# Patient Record
Sex: Male | Born: 1990 | Race: White | Hispanic: No | Marital: Single | Smoking: Former smoker
Health system: Southern US, Community
[De-identification: ages and names within clinical notes are randomized; demographics above are authoritative.]

## PROBLEM LIST (undated history)

## (undated) DIAGNOSIS — I10 Essential (primary) hypertension: Principal | ICD-10-CM

## (undated) DIAGNOSIS — R569 Unspecified convulsions: Secondary | ICD-10-CM

## (undated) HISTORY — DX: Essential (primary) hypertension: I10

---
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
## 2018-01-11 ENCOUNTER — Encounter: Payer: Self-pay | Admitting: Family Medicine

## 2018-01-11 ENCOUNTER — Ambulatory Visit (INDEPENDENT_AMBULATORY_CARE_PROVIDER_SITE_OTHER): Payer: Self-pay | Admitting: Family Medicine

## 2018-01-11 VITALS — BP 144/98 | HR 88 | Temp 97.9°F | Resp 14 | Ht 71.0 in | Wt 212.0 lb

## 2018-01-11 DIAGNOSIS — R569 Unspecified convulsions: Secondary | ICD-10-CM

## 2018-01-11 DIAGNOSIS — Z0000 Encounter for general adult medical examination without abnormal findings: Secondary | ICD-10-CM

## 2018-01-11 LAB — POCT URINALYSIS DIPSTICK
Bilirubin, UA: NEGATIVE
Blood, UA: NEGATIVE
Glucose, UA: NEGATIVE
Ketones, UA: NEGATIVE
LEUKOCYTES UA: NEGATIVE
NITRITE UA: NEGATIVE
PROTEIN UA: NEGATIVE
SPEC GRAV UA: 1.01 (ref 1.010–1.025)
Urobilinogen, UA: 0.2 E.U./dL
pH, UA: 7 (ref 5.0–8.0)
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
## 2018-01-12 ENCOUNTER — Telehealth: Payer: Self-pay

## 2018-01-12 LAB — CBC WITH DIFFERENTIAL/PLATELET
BASOS: 1 %
Basophils Absolute: 0 10*3/uL (ref 0.0–0.2)
EOS (ABSOLUTE): 0.4 10*3/uL (ref 0.0–0.4)
EOS: 7 %
HEMATOCRIT: 42.9 % (ref 37.5–51.0)
Hemoglobin: 14.4 g/dL (ref 13.0–17.7)
Immature Grans (Abs): 0 10*3/uL (ref 0.0–0.1)
Immature Granulocytes: 0 %
LYMPHS ABS: 1.6 10*3/uL (ref 0.7–3.1)
Lymphs: 26 %
MCH: 29.6 pg (ref 26.6–33.0)
MCHC: 33.6 g/dL (ref 31.5–35.7)
MCV: 88 fL (ref 79–97)
MONOS ABS: 0.7 10*3/uL (ref 0.1–0.9)
Monocytes: 11 %
Neutrophils Absolute: 3.5 10*3/uL (ref 1.4–7.0)
Neutrophils: 55 %
Platelets: 225 10*3/uL (ref 150–379)
RBC: 4.86 x10E6/uL (ref 4.14–5.80)
RDW: 12.6 % (ref 12.3–15.4)
WBC: 6.3 10*3/uL (ref 3.4–10.8)

## 2018-01-12 LAB — COMPREHENSIVE METABOLIC PANEL
ALT: 47 IU/L — AB (ref 0–44)
AST: 24 IU/L (ref 0–40)
Albumin/Globulin Ratio: 2 (ref 1.2–2.2)
Albumin: 4.9 g/dL (ref 3.5–5.5)
Alkaline Phosphatase: 70 IU/L (ref 39–117)
BUN/Creatinine Ratio: 7 — ABNORMAL LOW (ref 9–20)
BUN: 8 mg/dL (ref 6–20)
Bilirubin Total: <0.2 mg/dL (ref 0.0–1.2)
CALCIUM: 9.9 mg/dL (ref 8.7–10.2)
CO2: 25 mmol/L (ref 20–29)
CREATININE: 1.09 mg/dL (ref 0.76–1.27)
Chloride: 102 mmol/L (ref 96–106)
GFR calc Af Amer: 108 mL/min/{1.73_m2} (ref >59)
GFR, EST NON AFRICAN AMERICAN: 94 mL/min/{1.73_m2} (ref >59)
Globulin, Total: 2.4 g/dL (ref 1.5–4.5)
Glucose: 109 mg/dL — ABNORMAL HIGH (ref 65–99)
POTASSIUM: 4.4 mmol/L (ref 3.5–5.2)
Sodium: 142 mmol/L (ref 134–144)
Total Protein: 7.3 g/dL (ref 6.0–8.5)

## 2018-01-12 LAB — LIPID PANEL
CHOL/HDL RATIO: 4.6 ratio (ref 0.0–5.0)
Cholesterol, Total: 179 mg/dL (ref 100–199)
HDL: 39 mg/dL — ABNORMAL LOW (ref >39)
LDL Calculated: 81 mg/dL (ref 0–99)
Triglycerides: 297 mg/dL — ABNORMAL HIGH (ref 0–149)
VLDL Cholesterol Cal: 59 mg/dL — ABNORMAL HIGH (ref 5–40)

## 2018-01-12 LAB — TSH: TSH: 2.47 u[IU]/mL (ref 0.450–4.500)

## 2018-01-12 NOTE — Telephone Encounter (Signed)
Tried calling patient to give lab results. His voice message system immediately answers, saying the person you are trying to reach has a voice mailbox that has not been set up yet. Will try calling patient at a later time.

## 2018-01-12 NOTE — Telephone Encounter (Signed)
-----   Message from Maple Hudsonichard L Gilbert Jr., MD sent at 01/12/2018 10:32 AM EDT ----- Labs OK--Limit alcohol/tylenol--very mild liver inflammation.

## 2018-01-16 NOTE — Telephone Encounter (Signed)
Tried calling; no answer.   Thanks,   -Terril Amaro  

## 2018-01-17 NOTE — Telephone Encounter (Signed)
NA

## 2018-01-18 NOTE — Telephone Encounter (Signed)
Patient advised of labs and appointment has been made for EKG.

## 2018-01-18 NOTE — Telephone Encounter (Signed)
Patient was notified of results. Patient wanted to know if his DMV forms have been completed? Please advise?

## 2018-01-18 NOTE — Telephone Encounter (Signed)
Needs ECG before I can sign off on them. I should have done this last week and forgot.

## 2018-01-23 ENCOUNTER — Ambulatory Visit (INDEPENDENT_AMBULATORY_CARE_PROVIDER_SITE_OTHER): Payer: Self-pay | Admitting: Family Medicine

## 2018-01-23 DIAGNOSIS — R569 Unspecified convulsions: Secondary | ICD-10-CM

## 2018-01-23 NOTE — Progress Notes (Signed)
Pt evidently forgot about his neurology appt last week. Patients Mother is with him today. Advised he cannot drive until he is at least cleared by Neurology. He has appt later this week. DMV papers are given for pt/mother to take to Neurology,.   I have done the exam and reviewed the chart and it is accurate to the best of my knowledge. [employer]  technology has been used and  any errors in dictation or transcription are unintentional. Julieanne Mansonichard Gilbert M.D. [facility]

## 2018-01-25 ENCOUNTER — Encounter: Payer: Self-pay | Admitting: Emergency Medicine

## 2018-01-25 ENCOUNTER — Emergency Department: Payer: Self-pay

## 2018-01-25 ENCOUNTER — Emergency Department
Admission: EM | Admit: 2018-01-25 | Discharge: 2018-01-25 | Disposition: A | Discharge status: Home / Self Care | Payer: Self-pay | Attending: Emergency Medicine | Admitting: Emergency Medicine

## 2018-01-25 DIAGNOSIS — R51 Headache: Secondary | ICD-10-CM | POA: Insufficient documentation

## 2018-01-25 DIAGNOSIS — R569 Unspecified convulsions: Secondary | ICD-10-CM | POA: Insufficient documentation

## 2018-01-25 DIAGNOSIS — Z820 Family history of epilepsy and other diseases of the nervous system: Secondary | ICD-10-CM | POA: Insufficient documentation

## 2018-01-25 DIAGNOSIS — R21 Rash and other nonspecific skin eruption: Secondary | ICD-10-CM | POA: Insufficient documentation

## 2018-01-25 DIAGNOSIS — R61 Generalized hyperhidrosis: Secondary | ICD-10-CM | POA: Insufficient documentation

## 2018-01-25 DIAGNOSIS — F1210 Cannabis abuse, uncomplicated: Secondary | ICD-10-CM | POA: Insufficient documentation

## 2018-01-25 DIAGNOSIS — R410 Disorientation, unspecified: Secondary | ICD-10-CM | POA: Insufficient documentation

## 2018-01-25 HISTORY — DX: Unspecified convulsions: R56.9

## 2018-01-25 LAB — URINALYSIS, COMPLETE (UACMP) WITH MICROSCOPIC
BACTERIA UA: NONE SEEN
Bilirubin Urine: NEGATIVE
GLUCOSE, UA: NEGATIVE mg/dL
Hgb urine dipstick: NEGATIVE
KETONES UR: NEGATIVE mg/dL
LEUKOCYTES UA: NEGATIVE
Nitrite: NEGATIVE
PROTEIN: NEGATIVE mg/dL
SQUAMOUS EPITHELIAL / LPF: NONE SEEN
Specific Gravity, Urine: 1.006 (ref 1.005–1.030)
pH: 7 (ref 5.0–8.0)

## 2018-01-25 LAB — URINE DRUG SCREEN, QUALITATIVE (ARMC ONLY)
Amphetamines, Ur Screen: NOT DETECTED
BENZODIAZEPINE, UR SCRN: NOT DETECTED
Barbiturates, Ur Screen: NOT DETECTED
CANNABINOID 50 NG, UR ~~LOC~~: POSITIVE — AB
COCAINE METABOLITE, UR ~~LOC~~: NOT DETECTED
MDMA (Ecstasy)Ur Screen: NOT DETECTED
Methadone Scn, Ur: NOT DETECTED
Opiate, Ur Screen: NOT DETECTED
PHENCYCLIDINE (PCP) UR S: NOT DETECTED
Tricyclic, Ur Screen: NOT DETECTED

## 2018-01-25 LAB — COMPREHENSIVE METABOLIC PANEL
ALK PHOS: 58 U/L (ref 38–126)
ALT: 26 U/L (ref 17–63)
AST: 32 U/L (ref 15–41)
Albumin: 5.2 g/dL — ABNORMAL HIGH (ref 3.5–5.0)
Anion gap: 9 (ref 5–15)
BUN: 10 mg/dL (ref 6–20)
CALCIUM: 9.7 mg/dL (ref 8.9–10.3)
CHLORIDE: 103 mmol/L (ref 101–111)
CO2: 26 mmol/L (ref 22–32)
Creatinine, Ser: 1.12 mg/dL (ref 0.61–1.24)
GFR calc non Af Amer: >60 mL/min (ref >60)
GLUCOSE: 121 mg/dL — AB (ref 65–99)
Potassium: 4 mmol/L (ref 3.5–5.1)
SODIUM: 138 mmol/L (ref 135–145)
Total Bilirubin: 0.6 mg/dL (ref 0.3–1.2)
Total Protein: 8.5 g/dL — ABNORMAL HIGH (ref 6.5–8.1)

## 2018-01-25 LAB — CBC
HCT: 44.7 % (ref 40.0–52.0)
HEMOGLOBIN: 15.4 g/dL (ref 13.0–18.0)
MCH: 29.8 pg (ref 26.0–34.0)
MCHC: 34.4 g/dL (ref 32.0–36.0)
MCV: 86.6 fL (ref 80.0–100.0)
PLATELETS: 197 10*3/uL (ref 150–440)
RBC: 5.16 MIL/uL (ref 4.40–5.90)
RDW: 12.7 % (ref 11.5–14.5)
WBC: 14 10*3/uL — AB (ref 3.8–10.6)

## 2018-01-25 LAB — ETHANOL

## 2018-01-25 MED ORDER — DIAZEPAM 10 MG RE GEL: 10.0000 mg | Freq: Once | RECTAL | 0 refills | Status: AC

## 2018-01-25 MED ORDER — LEVETIRACETAM 500 MG PO TABS
500.0000 mg | ORAL_TABLET | Freq: Once | ORAL | Status: AC
  Administered 2018-01-25: 500 mg via ORAL
  Filled 2018-01-25: qty 1

## 2018-01-25 MED ORDER — DIPHENHYDRAMINE HCL 25 MG PO CAPS
25.0000 mg | ORAL_CAPSULE | Freq: Once | ORAL | Status: AC
Start: 2018-01-25 — End: 2018-01-25
  Administered 2018-01-25: 25 mg via ORAL
  Filled 2018-01-25: qty 1

## 2018-01-25 MED ORDER — SODIUM CHLORIDE 0.9 % IV BOLUS
1000.0000 mL | Freq: Once | INTRAVENOUS | Status: AC
  Administered 2018-01-25: 1000 mL via INTRAVENOUS

## 2018-01-25 MED ORDER — LEVETIRACETAM 500 MG PO TABS: 500.0000 mg | ORAL_TABLET | Freq: Two times a day (BID) | ORAL | 0 refills | Status: AC

## 2018-01-25 MED ORDER — ACETAMINOPHEN 500 MG PO TABS
1000.0000 mg | ORAL_TABLET | Freq: Once | ORAL | Status: AC
  Administered 2018-01-25: 1000 mg via ORAL
  Filled 2018-01-25: qty 2

## 2018-01-25 NOTE — Discharge Instructions (Addendum)
Please drink plenty of fluids stay well-hydrated, eat small regular healthy meals throughout the day, get plenty of rest.  Do not drive until you are cleared by the neurologist to do so.  Please start Keppra tomorrow.  If you develop a rash, take 25 mg of Benadryl by mouth, and stop the Keppra immediately.  Call Dr. Daisy BlossomPotter's office to let them know so that Dr. Malvin JohnsPotter can choose another medication.  If you develop swelling of the face, lightheadedness, difficulty breathing, take 50 mg of Benadryl and call 911.  Please read the instructions about how to use Diastat.  This medication should only be given for seizures that last more than 5 minutes, and do not stop on their own.  Stop using marijuana; this could influence your seizure like activity.  Return to the emergency department if you develop seizure, fever, severe pain, or any other symptoms concerning to you.
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
## 2018-01-25 NOTE — ED Triage Notes (Signed)
PT had witnessed seizure 1 hour prior to current time. Pt alert and oriented in triage. This is patients 4th witnessed seizure since December. No visualized injuries from seizure and pt denies any pain except a mild headache.

## 2018-01-25 NOTE — ED Notes (Signed)

## 2018-01-25 NOTE — ED Notes (Signed)
Patient transported to MRI 

## 2018-01-25 NOTE — ED Notes (Signed)
Pt with small, red bumps to forehead, pt also reports to chest, see MAR for follow up, pt denies itchiness or irritation.

## 2018-01-26 ENCOUNTER — Other Ambulatory Visit: Payer: Self-pay | Admitting: Neurology

## 2018-01-26 DIAGNOSIS — R569 Unspecified convulsions: Secondary | ICD-10-CM

## 2018-02-02 ENCOUNTER — Ambulatory Visit: Admission: RE | Admit: 2018-02-02 | Payer: Self-pay | Source: Ambulatory Visit

## 2018-05-15 ENCOUNTER — Ambulatory Visit (INDEPENDENT_AMBULATORY_CARE_PROVIDER_SITE_OTHER): Payer: Self-pay | Admitting: Family Medicine

## 2018-05-15 ENCOUNTER — Encounter: Payer: Self-pay | Admitting: Family Medicine

## 2018-05-15 VITALS — BP 138/96 | HR 84 | Temp 97.5°F | Resp 16 | Wt 206.0 lb

## 2018-05-15 DIAGNOSIS — I10 Essential (primary) hypertension: Secondary | ICD-10-CM

## 2018-05-15 MED ORDER — LISINOPRIL 10 MG PO TABS: 10.0000 mg | ORAL_TABLET | Freq: Every day | ORAL | 2 refills | Status: DC

## 2018-05-15 NOTE — Progress Notes (Signed)
Patient: Eddie Archer Male    DOB: 04/07/1991   27 y.o.   MRN: [id number] Visit Date: 05/15/2018  Today's Provider: Megan Mansichard Gilbert Jr, MD   Chief Complaint  Patient presents with  . Hypertension   Subjective:    Hypertension  This is a new problem. Pertinent negatives include no anxiety, blurred vision, chest pain, headaches, malaise/fatigue, neck pain, orthopnea, palpitations, peripheral edema, PND, shortness of breath or sweats. There are no associated agents to hypertension. Past treatments include lifestyle changes. There are no compliance problems.     BP Readings from Last 3 Encounters:  05/15/18 (!) 138/96  01/25/18 (!) 154/104  01/11/18 (!) 144/98   Wt Readings from Last 3 Encounters:  05/15/18 206 lb (93.4 kg)  01/25/18 212 lb (96.2 kg)  01/11/18 212 lb (96.2 kg)    No Known Allergies   Current Outpatient Medications:  .  levETIRAcetam (KEPPRA) 500 MG tablet, Take 1 tablet (500 mg total) by mouth 2 (two) times daily., Disp: 60 tablet, Rfl: 0 .  diazepam (DIASTAT ACUDIAL) 10 MG GEL, Place 10 mg rectally once for 1 dose., Disp: 1 Package, Rfl: 0  Review of Systems  Constitutional: Negative.  Negative for malaise/fatigue.  HENT: Negative.   Eyes: Negative.  Negative for blurred vision.  Respiratory: Negative.  Negative for shortness of breath.   Cardiovascular: Negative.  Negative for chest pain, palpitations, orthopnea and PND.  Gastrointestinal: Negative.   Endocrine: Negative.   Musculoskeletal: Negative for neck pain.  Allergic/Immunologic: Negative.   Neurological: Negative for headaches.  Psychiatric/Behavioral: Negative.     Social History   Tobacco Use  . Smoking status: Never Smoker  . Smokeless tobacco: Never Used  Substance Use Topics  . Alcohol use: Yes    Comment: occasionally   Objective:   BP (!) 138/96 (BP Location: Left Arm, Patient Position: Sitting, Cuff Size: Large)   Pulse 84   Temp (!) 97.5 F (36.4 C) (Oral)   Resp 16    Wt 206 lb (93.4 kg)   BMI 28.73 kg/m  Vitals:   05/15/18 1131  BP: (!) 138/96  Pulse: 84  Resp: 16  Temp: (!) 97.5 F (36.4 C)  TempSrc: Oral  Weight: 206 lb (93.4 kg)     Physical Exam  Constitutional: He is oriented to person, place, and time. He appears well-developed and well-nourished.  HENT:  Head: Normocephalic and atraumatic.  Eyes: Conjunctivae are normal. No scleral icterus.  Neck: No thyromegaly present.  Cardiovascular: Normal rate, regular rhythm and normal heart sounds.  Pulmonary/Chest: Effort normal and breath sounds normal.  Abdominal: Soft.  Neurological: He is alert and oriented to person, place, and time.  Skin: Skin is warm and dry.  Psychiatric: He has a normal mood and affect. His behavior is normal. Judgment and thought content normal.        Assessment & Plan:     1. Essential hypertension RTC 1-2 months. - lisinopril (PRINIVIL,ZESTRIL) 10 MG tablet; Take 1 tablet (10 mg total) by mouth daily.  Dispense: 30 tablet; Refill: 2 2.Seizure Per neurology. Pt admits he may have beendrinking up to a 12 pack of beer per day earlier this year.    I have done the exam and reviewed the chart and it is accurate to the best of my knowledge. [employer]agon  technology has been used and  any errors in dictation or transcription are unintentional. Julieanne Mansonichard Gilbert M.D. [facility]  Richard Cranford Mon, MD  [facility]

## 2018-05-24 ENCOUNTER — Ambulatory Visit (INDEPENDENT_AMBULATORY_CARE_PROVIDER_SITE_OTHER): Payer: Self-pay | Admitting: Physician Assistant

## 2018-05-24 ENCOUNTER — Encounter: Payer: Self-pay | Admitting: Physician Assistant

## 2018-05-24 VITALS — BP 140/90 | HR 91 | Temp 98.4°F | Resp 16 | Wt 202.8 lb

## 2018-05-24 DIAGNOSIS — J40 Bronchitis, not specified as acute or chronic: Secondary | ICD-10-CM

## 2018-05-24 MED ORDER — AZITHROMYCIN 250 MG PO TABS: ORAL_TABLET | ORAL | 0 refills | Status: DC

## 2018-05-24 MED ORDER — ALBUTEROL SULFATE HFA 108 (90 BASE) MCG/ACT IN AERS: 2.0000 | INHALATION_SPRAY | Freq: Four times a day (QID) | RESPIRATORY_TRACT | 0 refills | Status: DC | PRN

## 2018-05-24 MED ORDER — PREDNISONE 20 MG PO TABS: ORAL_TABLET | ORAL | 0 refills | Status: DC

## 2018-05-24 NOTE — Progress Notes (Signed)
Patient: Eddie Archer Male    DOB: 01/03/1991   27 y.o.   MRN: [id number] Visit Date: 05/24/2018  Today's Provider: Margaretann LovelessJennifer M Nonna Renninger, PA-C   Chief Complaint  Patient presents with  . URI   Subjective:    URI   This is a new problem. The current episode started 1 to 4 weeks ago. Associated symptoms include congestion ("chest congestion"), coughing, headaches and rhinorrhea. Pertinent negatives include no chest pain, ear pain, sinus pain, sneezing or sore throat.      No Known Allergies   Current Outpatient Medications:  .  diazepam (DIASTAT ACUDIAL) 10 MG GEL, Place 10 mg rectally once for 1 dose., Disp: 1 Package, Rfl: 0 .  levETIRAcetam (KEPPRA) 500 MG tablet, Take 1 tablet (500 mg total) by mouth 2 (two) times daily., Disp: 60 tablet, Rfl: 0 .  lisinopril (PRINIVIL,ZESTRIL) 10 MG tablet, Take 1 tablet (10 mg total) by mouth daily., Disp: 30 tablet, Rfl: 2  Review of Systems  Constitutional: Positive for appetite change and diaphoresis.  HENT: Positive for congestion ("chest congestion"), postnasal drip and rhinorrhea. Negative for ear pain, sinus pressure, sinus pain, sneezing, sore throat and trouble swallowing.   Respiratory: Positive for cough, chest tightness and shortness of breath.   Cardiovascular: Negative for chest pain, palpitations and leg swelling.  Neurological: Positive for headaches. Negative for dizziness and light-headedness.    Social History   Tobacco Use  . Smoking status: Never Smoker  . Smokeless tobacco: Never Used  Substance Use Topics  . Alcohol use: Yes    Comment: occasionally   Objective:   BP 140/90 (BP Location: Left Arm, Patient Position: Sitting, Cuff Size: Large)   Pulse 91   Temp 98.4 F (36.9 C) (Oral)   Resp 16   Wt 202 lb 12.8 oz (92 kg)   SpO2 99%   BMI 28.28 kg/m  Vitals:   05/24/18 1510  BP: 140/90  Pulse: 91  Resp: 16  Temp: 98.4 F (36.9 C)  TempSrc: Oral  SpO2: 99%  Weight: 202 lb 12.8 oz (92 kg)      Physical Exam  Constitutional: He appears well-developed and well-nourished. No distress.  HENT:  Head: Normocephalic and atraumatic.  Right Ear: Hearing, tympanic membrane, external ear and ear canal normal.  Left Ear: Hearing, tympanic membrane, external ear and ear canal normal.  Nose: Nose normal.  Mouth/Throat: Uvula is midline, oropharynx is clear and moist and mucous membranes are normal. No oropharyngeal exudate, posterior oropharyngeal edema or posterior oropharyngeal erythema.  Eyes: Pupils are equal, round, and reactive to light. Conjunctivae and EOM are normal. Right eye exhibits no discharge. Left eye exhibits no discharge.  Neck: Normal range of motion. Neck supple. No JVD present. No tracheal deviation present. No Brudzinski's sign and no Kernig's sign noted. No thyromegaly present.  Cardiovascular: Normal rate, regular rhythm and normal heart sounds. Exam reveals no gallop and no friction rub.  No murmur heard. Pulmonary/Chest: Effort normal. No stridor. No respiratory distress. He has wheezes (throughout). He has no rhonchi. He has rales (throughout). He exhibits no tenderness.  Lymphadenopathy:    He has no cervical adenopathy.  Skin: Skin is warm and dry.  Vitals reviewed.      Assessment & Plan:     1. Bronchitis Worsening symptoms that have not responded to OTC medications. Will give Zpak and prednisone as below. Continue allergy medications. Stay well hydrated and get plenty of rest. Call if no symptom  improvement or if symptoms worsen. - azithromycin (ZITHROMAX) 250 MG tablet; Take 2 tablets PO on day one, and one tablet PO daily thereafter until completed.  Dispense: 6 tablet; Refill: 0 - predniSONE (DELTASONE) 20 MG tablet; Take 3 tabs PO on day 1, 2 tabs PO on day 2 and 1 tab PO on day 1.  Dispense: 6 tablet; Refill: 0       Margaretann Loveless, PA-C  [facility]

## 2018-05-24 NOTE — Patient Instructions (Signed)
Albuterol (ProAir or Ventolin) inhaler can be used every 4-6 hours as needed for shortness of breath   Acute Bronchitis, Adult Acute bronchitis is sudden (acute) swelling of the air tubes (bronchi) in the lungs. Acute bronchitis causes these tubes to fill with mucus, which can make it hard to breathe. It can also cause coughing or wheezing. In adults, acute bronchitis usually goes away within 2 weeks. A cough caused by bronchitis may last up to 3 weeks. Smoking, allergies, and asthma can make the condition worse. Repeated episodes of bronchitis may cause further lung problems, such as chronic obstructive pulmonary disease (COPD). What are the causes? This condition can be caused by germs and by substances that irritate the lungs, including:  Cold and flu viruses. This condition is most often caused by the same virus that causes a cold.  Bacteria.  Exposure to tobacco smoke, dust, fumes, and air pollution.  What increases the risk? This condition is more likely to develop in people who:  Have close contact with someone with acute bronchitis.  Are exposed to lung irritants, such as tobacco smoke, dust, fumes, and vapors.  Have a weak immune system.  Have a respiratory condition such as asthma.  What are the signs or symptoms? Symptoms of this condition include:  A cough.  Coughing up clear, yellow, or green mucus.  Wheezing.  Chest congestion.  Shortness of breath.  A fever.  Body aches.  Chills.  A sore throat.  How is this diagnosed? This condition is usually diagnosed with a physical exam. During the exam, your health care provider may order tests, such as chest X-rays, to rule out other conditions. He or she may also:  Test a sample of your mucus for bacterial infection.  Check the level of oxygen in your blood. This is done to check for pneumonia.  Do a chest X-ray or lung function testing to rule out pneumonia and other conditions.  Perform blood  tests.  Your health care provider will also ask about your symptoms and medical history. How is this treated? Most cases of acute bronchitis clear up over time without treatment. Your health care provider may recommend:  Drinking more fluids. Drinking more makes your mucus thinner, which may make it easier to breathe.  Taking a medicine for a fever or cough.  Taking an antibiotic medicine.  Using an inhaler to help improve shortness of breath and to control a cough.  Using a cool mist vaporizer or humidifier to make it easier to breathe.  Follow these instructions at home: Medicines  Take over-the-counter and prescription medicines only as told by your health care provider.  If you were prescribed an antibiotic, take it as told by your health care provider. Do not stop taking the antibiotic even if you start to feel better. General instructions  Get plenty of rest.  Drink enough fluids to keep your urine clear or pale yellow.  Avoid smoking and secondhand smoke. Exposure to cigarette smoke or irritating chemicals will make bronchitis worse. If you smoke and you need help quitting, ask your health care provider. Quitting smoking will help your lungs heal faster.  Use an inhaler, cool mist vaporizer, or humidifier as told by your health care provider.  Keep all follow-up visits as told by your health care provider. This is important. How is this prevented? To lower your risk of getting this condition again:  Wash your hands often with soap and water. If soap and water are not available, use hand  sanitizer.  Avoid contact with people who have cold symptoms.  Try not to touch your hands to your mouth, nose, or eyes.  Make sure to get the flu shot every year.  Contact a health care provider if:  Your symptoms do not improve in 2 weeks of treatment. Get help right away if:  You cough up blood.  You have chest pain.  You have severe shortness of breath.  You become  dehydrated.  You faint or keep feeling like you are going to faint.  You keep vomiting.  You have a severe headache.  Your fever or chills gets worse. This information is not intended to replace advice given to you by your health care provider. Make sure you discuss any questions you have with your health care provider. Document Released: 11/18/2004 Document Revised: 05/05/2016 Document Reviewed: 03/31/2016 Elsevier Interactive Patient Education  [employer].

## 2018-05-24 NOTE — Addendum Note (Signed)
Addended by: Margaretann LovelessBURNETTE, Hayze Gazda M on: 05/24/2018 04:03 PM   Modules accepted: Orders

## 2018-06-06 ENCOUNTER — Other Ambulatory Visit: Payer: Self-pay | Admitting: Family Medicine

## 2018-06-06 DIAGNOSIS — I10 Essential (primary) hypertension: Secondary | ICD-10-CM

## 2018-07-10 ENCOUNTER — Ambulatory Visit: Payer: Self-pay | Admitting: Family Medicine

## 2018-07-10 ENCOUNTER — Encounter: Payer: Self-pay | Admitting: Family Medicine

## 2018-07-10 VITALS — BP 138/90 | HR 88 | Temp 97.8°F | Resp 16 | Wt 206.0 lb

## 2018-07-10 DIAGNOSIS — G40909 Epilepsy, unspecified, not intractable, without status epilepticus: Secondary | ICD-10-CM | POA: Insufficient documentation

## 2018-07-10 DIAGNOSIS — Z23 Encounter for immunization: Secondary | ICD-10-CM

## 2018-07-10 DIAGNOSIS — I10 Essential (primary) hypertension: Secondary | ICD-10-CM | POA: Insufficient documentation

## 2018-07-10 HISTORY — DX: Essential (primary) hypertension: I10

## 2018-07-10 NOTE — Progress Notes (Signed)
Eddie Archer  MRN: [id number] DOB: 06/09/1991  Subjective:  HPI   The patient is a 27 year old male who presents for follow up of hypertension.  He was last seen for this on 05/15/18.  He has since that time had an acute visit in our office and he has been to see neurology in August.   The patient reports good compliance and tolerance to the medication.  He does not check his blood pressure outside of our office.  However, he did see Dr Eddie Archer and the patient reports his blood pressure was good at that visit.  He is lifting weights but not getting a cardiac work-out. BP Readings from Last 3 Encounters:  07/10/18 138/90  05/24/18 140/90  05/15/18 (!) 138/96   Second blood pressure reading today was 140/94  There are no active problems to display for this patient.   Past Medical History:  Diagnosis Date  . Seizures (HCC)     Social History   Socioeconomic History  . Marital status: Single    Spouse name: Not on file  . Number of children: Not on file  . Years of education: Not on file  . Highest education level: Not on file  Occupational History  . Occupation: unemployed  Social Needs  . Financial resource strain: Not on file  . Food insecurity:    Worry: Not on file    Inability: Not on file  . Transportation needs:    Medical: Not on file    Non-medical: Not on file  Tobacco Use  . Smoking status: Never Smoker  . Smokeless tobacco: Never Used  Substance and Sexual Activity  . Alcohol use: Yes    Comment: occasionally  . Drug use: No  . Sexual activity: Not on file  Lifestyle  . Physical activity:    Days per week: Not on file    Minutes per session: Not on file  . Stress: Not on file  Relationships  . Social connections:    Talks on phone: Not on file    Gets together: Not on file    Attends religious service: Not on file    Active member of club or organization: Not on file    Attends meetings of clubs or organizations: Not on file    Relationship  status: Not on file  . Intimate partner violence:    Fear of current or ex partner: Not on file    Emotionally abused: Not on file    Physically abused: Not on file    Forced sexual activity: Not on file  Other Topics Concern  . Not on file  Social History Narrative  . Not on file    Outpatient Encounter Medications as of 07/10/2018  Medication Sig  . levETIRAcetam (KEPPRA) 500 MG tablet Take 1 tablet (500 mg total) by mouth 2 (two) times daily.  Marland Kitchen. lisinopril (PRINIVIL,ZESTRIL) 10 MG tablet TAKE 1 TABLET BY MOUTH EVERY DAY  . diazepam (DIASTAT ACUDIAL) 10 MG GEL Place 10 mg rectally once for 1 dose.  . [DISCONTINUED] albuterol (PROVENTIL HFA;VENTOLIN HFA) 108 (90 Base) MCG/ACT inhaler Inhale 2 puffs into the lungs every 6 (six) hours as needed for wheezing or shortness of breath.  . [DISCONTINUED] azithromycin (ZITHROMAX) 250 MG tablet Take 2 tablets PO on day one, and one tablet PO daily thereafter until completed.  . [DISCONTINUED] predniSONE (DELTASONE) 20 MG tablet Take 3 tabs PO on day 1, 2 tabs PO on day 2 and 1 tab PO  on day 1.   No facility-administered encounter medications on file as of 07/10/2018.     No Known Allergies  Review of Systems  Constitutional: Negative for fever and malaise/fatigue.  Respiratory: Negative for cough, shortness of breath and wheezing.   Cardiovascular: Negative for chest pain, palpitations, orthopnea, claudication and leg swelling.  Neurological: Negative for dizziness, seizures and headaches.  Psychiatric/Behavioral: Negative.     Objective:  BP 138/90 (BP Location: Right Arm, Patient Position: Sitting, Cuff Size: Normal)   Pulse 88   Temp 97.8 F (36.6 C) (Oral)   Resp 16   Wt 206 lb (93.4 kg)   SpO2 99%   BMI 28.73 kg/m   Physical Exam  Constitutional: He is oriented to person, place, and time and well-developed, well-nourished, and in no distress.  HENT:  Head: Normocephalic and atraumatic.  Eyes: Conjunctivae are normal.  Neck:  No thyromegaly present.  Cardiovascular: Normal rate, regular rhythm and normal heart sounds.  Pulmonary/Chest: Effort normal and breath sounds normal.  Neurological: He is alert and oriented to person, place, and time. Gait normal. GCS score is 15.  Skin: Skin is warm and dry.  Psychiatric: Mood, memory, affect and judgment normal.    Assessment and Plan :  1. Essential hypertension Work on aerobic exercise.  Follow-up 6 months  2. Need for influenza vaccination  - Flu Vaccine QUAD 6+ mos PF IM (Fluarix Quad PF) 3.Seizures Neurology  I have done the exam and reviewed the chart and it is accurate to the best of my knowledge. Dentist has been used and  any errors in dictation or transcription are unintentional. Julieanne Manson M.D. [facility]

## 2018-08-08 ENCOUNTER — Emergency Department
Admission: EM | Admit: 2018-08-08 | Discharge: 2018-08-08 | Disposition: A | Discharge status: Home / Self Care | Payer: Self-pay | Attending: Student in an Organized Health Care Education/Training Program | Admitting: Student in an Organized Health Care Education/Training Program

## 2018-08-08 ENCOUNTER — Other Ambulatory Visit: Payer: Self-pay

## 2018-08-08 ENCOUNTER — Encounter: Payer: Self-pay | Admitting: Emergency Medicine

## 2018-08-08 DIAGNOSIS — Z79899 Other long term (current) drug therapy: Secondary | ICD-10-CM | POA: Insufficient documentation

## 2018-08-08 DIAGNOSIS — I10 Essential (primary) hypertension: Secondary | ICD-10-CM | POA: Insufficient documentation

## 2018-08-08 DIAGNOSIS — T402X4A Poisoning by other opioids, undetermined, initial encounter: Secondary | ICD-10-CM | POA: Insufficient documentation

## 2018-08-08 DIAGNOSIS — T40604A Poisoning by unspecified narcotics, undetermined, initial encounter: Secondary | ICD-10-CM

## 2018-08-08 DIAGNOSIS — Z87891 Personal history of nicotine dependence: Secondary | ICD-10-CM | POA: Insufficient documentation

## 2018-08-08 DIAGNOSIS — T40601A Poisoning by unspecified narcotics, accidental (unintentional), initial encounter: Secondary | ICD-10-CM

## 2018-08-08 MED ORDER — ONDANSETRON 4 MG PO TBDP
8.0000 mg | ORAL_TABLET | Freq: Once | ORAL | Status: AC
Start: 2018-08-08 — End: 2018-08-08
  Administered 2018-08-08: 8 mg via ORAL
  Filled 2018-08-08: qty 2

## 2018-08-08 NOTE — ED Provider Notes (Signed)
 -----------------------------------------   6:57 PM on 08/08/2018 -----------------------------------------  Discussed with [facility] after his evaluation, stable for discharge home.  No danger to himself or others with his accidental opioid overdose.  Plan to discharge home to follow-up with outpatient providers.   Sharman Cheek, MD 08/08/18 [phone]

## 2018-08-08 NOTE — ED Notes (Signed)
Pt belongings black shirt with blue logo on it, black colored basketball shorts, 1 pair of dark colored underware, 1 pair of black colored socks. 2 rubber bracelets.   His wallet, watch and cash were put in a valuable envelope and given to campus police.

## 2018-08-08 NOTE — ED Notes (Signed)
Pt's mother visiting with patient.   Maintained on 15 minute checks and observation by security camera for safety.

## 2018-08-08 NOTE — ED Provider Notes (Signed)
[facility] Emergency Department Provider Note    First MD Initiated Contact with Patient 08/08/18 1536     (approximate)  I have reviewed the triage vital signs and the nursing notes.   HISTORY  Chief Complaint Drug Overdose    HPI ELPIDIO THIELEN is a 27 y.o. male the history of seizures presents the ER after taking 1230 mg oxycodone around 2:00.  Patient states that he snorted this medication.  Patient was to play parking line passerby saw that he appeared unresponsive with blue lips.  EMS was called police initially arrived and found the patient very dyspneic and cyanotic.  He was given 2 mg of Narcan intranasally.  EMS subsequently arrived and gave an additional 8 mg of Narcan and 4 of IV Zofran with adequate response.  Patient still appears slightly intoxicated.  Denies any intent for self-harm.  States that he "experiments with drugs "on a weekly basis."    Past Medical History:  Diagnosis Date  . Essential hypertension 07/10/2018  . Seizures (HCC)    Family History  Problem Relation Age of Onset  . Hypertension Mother   . Skin cancer Mother   . Hypertension Father   . Diabetes Father   . Hypertension Maternal Grandmother   . Hypertension Maternal Grandfather   . Cancer Paternal Grandmother        breast cancer  . Diabetes Paternal Grandfather    History reviewed. No pertinent surgical history. Patient Active Problem List   Diagnosis Date Noted  . Essential hypertension 07/10/2018  . Seizure disorder (HCC) 07/10/2018      Prior to Admission medications   Medication Sig Start Date End Date Taking? Authorizing Provider  diazepam (DIASTAT ACUDIAL) 10 MG GEL Place 10 mg rectally once for 1 dose. 01/25/18 05/24/18  Rockne Menghini, MD  levETIRAcetam (KEPPRA) 500 MG tablet Take 1 tablet (500 mg total) by mouth 2 (two) times daily. 01/25/18   Rockne Menghini, MD  lisinopril (PRINIVIL,ZESTRIL) 10 MG tablet TAKE 1 TABLET BY MOUTH EVERY DAY  06/06/18   Maple Hudson., MD    Allergies Patient has no known allergies.    Social History Social History   Tobacco Use  . Smoking status: Former Games developer  . Smokeless tobacco: Never Used  Substance Use Topics  . Alcohol use: Yes    Comment: occasionally  . Drug use: Yes    Comment: opiod pills    Review of Systems Patient denies headaches, rhinorrhea, blurry vision, numbness, shortness of breath, chest pain, edema, cough, abdominal pain, nausea, vomiting, diarrhea, dysuria, fevers, rashes or hallucinations unless otherwise stated above in HPI. ____________________________________________   PHYSICAL EXAM:  VITAL SIGNS: Vitals:   08/08/18 1600 08/08/18 1630  BP: 124/87 119/82  Pulse: (!) 103 96  Resp: (!) 21 17  Temp:    SpO2: 92% 95%    Constitutional: Alert, protecting his airway.  Does appear intoxicated but answering questions appropriately. Eyes: Conjunctivae are normal.  Pupils are dilated Head: Atraumatic. Nose: No congestion/rhinnorhea. Mouth/Throat: Mucous membranes are moist.   Neck: No stridor. Painless ROM.  Cardiovascular: Normal rate, regular rhythm. Grossly normal heart sounds.  Good peripheral circulation. Respiratory: Normal respiratory effort.  No retractions. Lungs CTAB. Gastrointestinal: Soft and nontender. No distention. No abdominal bruits. No CVA tenderness. Genitourinary:  Musculoskeletal: No lower extremity tenderness nor edema.  No joint effusions. Neurologic:  Normal speech and language. No gross focal neurologic deficits are appreciated. No facial droop Skin:  Skin is warm, dry  and intact. No rash noted. Psychiatric: Mood and affect are normal. Speech and behavior are normal.  ____________________________________________   LABS (all labs ordered are listed, but only abnormal results are displayed)  No results found for this or any previous visit (from the past 24  hour(s)). ____________________________________________  EKG My review and personal interpretation at Time: 16:52   Indication: overdose  Rate: 95  Rhythm: sinus Axis: normal Other: normal intervals, no stemi ____________________________________________  RADIOLOGY   ____________________________________________   PROCEDURES  Procedure(s) performed:  Procedures    Critical Care performed: no ____________________________________________   INITIAL IMPRESSION / ASSESSMENT AND PLAN / ED COURSE  Pertinent labs & imaging results that were available during my care of the patient were reviewed by me and considered in my medical decision making (see chart for details).   DDX: Overdose, dysrhythmia, polysubstance abuse, suicide attempt  CHRISTROPHER GINTZ is a 27 y.o. who presents to the ED with symptoms as described above.  Patient required significant amount of Narcan after fairly large opiate overdose.  Patient arrives IVC as he is refusing further evaluation.  Based on his reckless behavior, persistent intoxication will continue IVC.  Do feel patient would benefit from psychiatric evaluation given his substance abuse history with obvious demonstration of self-harm.  Have discussed with the patient and available family all diagnostics and treatments performed thus far and all questions were answered to the best of my ability. The patient demonstrates understanding and agreement with plan.       As part of my medical decision making, I reviewed the following data within the electronic MEDICAL RECORD NUMBER Nursing notes reviewed and incorporated, Labs reviewed, notes from prior ED visits.  ____________________________________________   FINAL CLINICAL IMPRESSION(S) / ED DIAGNOSES  Final diagnoses:  Opiate overdose, undetermined intent, initial encounter (HCC)      NEW MEDICATIONS STARTED DURING THIS VISIT:  New Prescriptions   No medications on file     Note:  This document was  prepared using Dragon voice recognition software and may include unintentional dictation errors.    Willy Eddy, MD 08/08/18 1700

## 2018-08-08 NOTE — Consult Note (Signed)
[facility] Face-to-Face Psychiatry Consult   Reason for Consult: Consult for this 27 year old man brought to the hospital after overdosing on opiates Referring Physician: Scotty Court Patient Identification: Eddie Archer MRN:  [id number] Principal Diagnosis: Opiate overdose [facility]) Diagnosis:   Patient Active Problem List   Diagnosis Date Noted  . Opiate overdose (HCC) [T40.601A] 08/08/2018  . Essential hypertension [I10] 07/10/2018  . Seizure disorder [facility]) [G40.909] 07/10/2018    Total Time spent with patient: 1 hour  Subjective:   Eddie Archer is a 27 y.o. male patient admitted with "I was experimenting".  HPI: Patient seen chart reviewed.  27 year old man brought in by EMS after they were called to a patient who was turning blue and had stopped breathing.  Patient required quite a bit of Narcan for revival and was brought to the emergency room.  Patient tells me that he was snorting oxycodone.  Patient says that as far as he knows it was a 30 mg oxycodone.  He does not know where the idea that he took 12 of them came from he insists it was only 1 pill.  He says he was sitting in the back of his friend's car when he did it.  He only wanted to get high and experiment with drugs.  Patient says he does not normally use narcotics.  Had not been using any other narcotics recently.  Last time he used any drugs was Xanax a couple weeks ago.  Drinks only occasionally on weekends not drinking today.  Patient denies any mood symptoms.  Denies depression denies suicidal thoughts denies homicidal thoughts denies any psychotic symptoms.  Feels like his life is pretty good does not feel under a lot of stress sleeps and eats well.  Not receiving any mental health treatment.  Social history: Lives with his mother.  Planning to start a job tomorrow.  Medical history: Patient has a history of high blood pressure and seizures of unclear etiology.  Substance abuse history: The old chart did not document anything but the  patient says that he will occasionally experiment with drugs.  This is the first time he is presented to an emergency room or provide her with a drug related problem.  Past Psychiatric History: No history of any psychiatric or mental health treatment  Risk to Self:   Risk to Others:   Prior Inpatient Therapy:   Prior Outpatient Therapy:    Past Medical History:  Past Medical History:  Diagnosis Date  . Essential hypertension 07/10/2018  . Seizures (HCC)    History reviewed. No pertinent surgical history. Family History:  Family History  Problem Relation Age of Onset  . Hypertension Mother   . Skin cancer Mother   . Hypertension Father   . Diabetes Father   . Hypertension Maternal Grandmother   . Hypertension Maternal Grandfather   . Cancer Paternal Grandmother        breast cancer  . Diabetes Paternal Grandfather    Family Psychiatric  History: He had a brother who died of an opiate overdose Social History:  Social History   Substance and Sexual Activity  Alcohol Use Yes   Comment: occasionally     Social History   Substance and Sexual Activity  Drug Use Yes   Comment: opiod pills    Social History   Socioeconomic History  . Marital status: Single    Spouse name: Not on file  . Number of children: Not on file  . Years of education: Not on file  .  Highest education level: Not on file  Occupational History  . Occupation: unemployed  Social Needs  . Financial resource strain: Not on file  . Food insecurity:    Worry: Not on file    Inability: Not on file  . Transportation needs:    Medical: Not on file    Non-medical: Not on file  Tobacco Use  . Smoking status: Former Games developer  . Smokeless tobacco: Never Used  Substance and Sexual Activity  . Alcohol use: Yes    Comment: occasionally  . Drug use: Yes    Comment: opiod pills  . Sexual activity: Not on file  Lifestyle  . Physical activity:    Days per week: Not on file    Minutes per session: Not on  file  . Stress: Not on file  Relationships  . Social connections:    Talks on phone: Not on file    Gets together: Not on file    Attends religious service: Not on file    Active member of club or organization: Not on file    Attends meetings of clubs or organizations: Not on file    Relationship status: Not on file  Other Topics Concern  . Not on file  Social History Narrative  . Not on file   Additional Social History:    Allergies:  No Known Allergies  Labs: No results found for this or any previous visit (from the past 48 hour(s)).  No current facility-administered medications for this encounter.    Current Outpatient Medications  Medication Sig Dispense Refill  . diazepam (DIASTAT ACUDIAL) 10 MG GEL Place 10 mg rectally once for 1 dose. 1 Package 0  . levETIRAcetam (KEPPRA) 500 MG tablet Take 1 tablet (500 mg total) by mouth 2 (two) times daily. 60 tablet 0  . lisinopril (PRINIVIL,ZESTRIL) 10 MG tablet TAKE 1 TABLET BY MOUTH EVERY DAY 30 tablet 5    Musculoskeletal: Strength & Muscle Tone: within normal limits Gait & Station: normal Patient leans: N/A  Psychiatric Specialty Exam: Physical Exam  Nursing note and vitals reviewed. Constitutional: He appears well-developed and well-nourished.  HENT:  Head: Normocephalic and atraumatic.  Eyes: Pupils are equal, round, and reactive to light. Conjunctivae are normal.  Neck: Normal range of motion.  Cardiovascular: Regular rhythm and normal heart sounds.  Respiratory: Effort normal. No respiratory distress.  GI: Soft.  Musculoskeletal: Normal range of motion.  Neurological: He is alert.  Skin: Skin is warm and dry.  Psychiatric: He has a normal mood and affect. His speech is normal and behavior is normal. Judgment and thought content normal. Cognition and memory are normal.    Review of Systems  Constitutional: Negative.   HENT: Negative.   Eyes: Negative.   Respiratory: Negative.   Cardiovascular: Negative.    Gastrointestinal: Negative.   Musculoskeletal: Negative.   Skin: Negative.   Neurological: Negative.   Psychiatric/Behavioral: Positive for substance abuse. Negative for depression, hallucinations, memory loss and suicidal ideas. The patient is not nervous/anxious and does not have insomnia.     Blood pressure 119/82, pulse 96, temperature 97.7 F (36.5 C), temperature source Oral, resp. rate 17, height 6' (1.829 m), weight 92.5 kg, SpO2 95 %.Body mass index is 27.67 kg/m.  General Appearance: Casual  Eye Contact:  Good  Speech:  Clear and Coherent  Volume:  Normal  Mood:  Euthymic  Affect:  Congruent  Thought Process:  Goal Directed  Orientation:  Full (Time, Place, and Person)  Thought Content:  Logical  Suicidal Thoughts:  No  Homicidal Thoughts:  No  Memory:  Immediate;   Fair Recent;   Fair Remote;   Fair  Judgement:  Fair  Insight:  Fair  Psychomotor Activity:  Decreased  Concentration:  Concentration: Fair  Recall:  [employer] of Knowledge:  Fair  Language:  Fair  Akathisia:  No  Handed:  Right  AIMS (if indicated):     Assets:  Desire for Improvement Housing Leisure Time Physical Health  ADL's:  Intact  Cognition:  WNL  Sleep:        Treatment Plan Summary: Plan 27 year old man who almost died of an overdose of opiates evidently after snorting oxycodone.  Patient is now awake alert oriented and appears to be back to baseline.  Absolutely denies any suicidal intent or plan.  Denies any symptoms of depression or any other mental health symptoms.  Patient is cooperative with evaluation and says he feels like he has learned a lesson and will not do anything like this again.  Patient does not appear to have mental health problems that would warrant involuntary commitment.  Discontinue IVC.  Case reviewed with emergency room physician and nursing.  Patient counseled about the deadly effects obviously from narcotic abuse and will be given referral information for local  substance abuse counseling.  Disposition: No evidence of imminent risk to self or others at present.   Patient does not meet criteria for psychiatric inpatient admission. Supportive therapy provided about ongoing stressors. Discussed crisis plan, support from social network, calling 911, coming to the Emergency Department, and calling Suicide Hotline.  Mordecai Rasmussen, MD 08/08/2018 5:45 PM

## 2018-08-08 NOTE — ED Notes (Signed)
Pt to be discharged per psychiatry.  Pt remains calm and cooperative.   Maintained on 15 minute checks and observation by security camera for safety.

## 2018-08-08 NOTE — ED Triage Notes (Signed)
Pt presents to ED via [facility] from home c/o overdose on oxycodone. EMS reports pt took x12 30mg  oxycodone around 1400. Pt was found by friends with cyanosis to lips and low HR/RR. EMS gave 8mg  narcan intranasally and 2mg  narcan IV as well as 4mg  zofran IV. Pt is A&Ox4 on arrival but appears still somewhat intoxicated, pupils dilated. Pt denies SI, denies any attempted self-harm.

## 2018-08-08 NOTE — ED Notes (Signed)
Pt discharged to lobby with mother. VS stable. All belongings returned to patient (including valuables in safe). Pt denies SI/HI and AVH. Discharge paperwork reviewed with patient. Pt signed for his discharge.

## 2018-08-08 NOTE — ED Notes (Signed)
IVC  PAPERS  RESCINDED  PER  DR  CLAPACS  INFORMED  RN  AMY 

## 2018-09-26 ENCOUNTER — Emergency Department
Admission: EM | Admit: 2018-09-26 | Discharge: 2018-09-26 | Disposition: A | Discharge status: Home / Self Care | Payer: Self-pay | Attending: Emergency Medicine | Admitting: Emergency Medicine

## 2018-09-26 ENCOUNTER — Other Ambulatory Visit: Payer: Self-pay

## 2018-09-26 DIAGNOSIS — Z87891 Personal history of nicotine dependence: Secondary | ICD-10-CM | POA: Insufficient documentation

## 2018-09-26 DIAGNOSIS — F1210 Cannabis abuse, uncomplicated: Secondary | ICD-10-CM | POA: Insufficient documentation

## 2018-09-26 DIAGNOSIS — F1510 Other stimulant abuse, uncomplicated: Secondary | ICD-10-CM | POA: Insufficient documentation

## 2018-09-26 DIAGNOSIS — I10 Essential (primary) hypertension: Secondary | ICD-10-CM | POA: Insufficient documentation

## 2018-09-26 DIAGNOSIS — R569 Unspecified convulsions: Secondary | ICD-10-CM | POA: Insufficient documentation

## 2018-09-26 DIAGNOSIS — F1910 Other psychoactive substance abuse, uncomplicated: Secondary | ICD-10-CM

## 2018-09-26 DIAGNOSIS — Z79899 Other long term (current) drug therapy: Secondary | ICD-10-CM | POA: Insufficient documentation

## 2018-09-26 LAB — COMPREHENSIVE METABOLIC PANEL
ALBUMIN: 5.2 g/dL — AB (ref 3.5–5.0)
ALK PHOS: 61 U/L (ref 38–126)
ALT: 20 U/L (ref 0–44)
ANION GAP: 21 — AB (ref 5–15)
AST: 37 U/L (ref 15–41)
BILIRUBIN TOTAL: 0.5 mg/dL (ref 0.3–1.2)
BUN: 9 mg/dL (ref 6–20)
CALCIUM: 10.1 mg/dL (ref 8.9–10.3)
CO2: 16 mmol/L — ABNORMAL LOW (ref 22–32)
CREATININE: 1.53 mg/dL — AB (ref 0.61–1.24)
Chloride: 103 mmol/L (ref 98–111)
GFR calc non Af Amer: >60 mL/min (ref >60)
GLUCOSE: 180 mg/dL — AB (ref 70–99)
Potassium: 3.9 mmol/L (ref 3.5–5.1)
Sodium: 140 mmol/L (ref 135–145)
TOTAL PROTEIN: 8.9 g/dL — AB (ref 6.5–8.1)

## 2018-09-26 LAB — URINE DRUG SCREEN, QUALITATIVE (ARMC ONLY)
Amphetamines, Ur Screen: POSITIVE — AB
BENZODIAZEPINE, UR SCRN: NOT DETECTED
Barbiturates, Ur Screen: NOT DETECTED
Cannabinoid 50 Ng, Ur ~~LOC~~: POSITIVE — AB
Cocaine Metabolite,Ur ~~LOC~~: NOT DETECTED
MDMA (Ecstasy)Ur Screen: NOT DETECTED
METHADONE SCREEN, URINE: NOT DETECTED
OPIATE, UR SCREEN: NOT DETECTED
Phencyclidine (PCP) Ur S: NOT DETECTED
Tricyclic, Ur Screen: NOT DETECTED

## 2018-09-26 LAB — CBC WITH DIFFERENTIAL/PLATELET
ABS IMMATURE GRANULOCYTES: 0.1 10*3/uL — AB (ref 0.00–0.07)
Basophils Absolute: 0.1 10*3/uL (ref 0.0–0.1)
Basophils Relative: 1 %
EOS PCT: 1 %
Eosinophils Absolute: 0.1 10*3/uL (ref 0.0–0.5)
HEMATOCRIT: 48.3 % (ref 39.0–52.0)
HEMOGLOBIN: 15.8 g/dL (ref 13.0–17.0)
Immature Granulocytes: 1 %
LYMPHS ABS: 4.4 10*3/uL — AB (ref 0.7–4.0)
LYMPHS PCT: 32 %
MCH: 28.8 pg (ref 26.0–34.0)
MCHC: 32.7 g/dL (ref 30.0–36.0)
MCV: 88 fL (ref 80.0–100.0)
MONO ABS: 1.3 10*3/uL — AB (ref 0.1–1.0)
Monocytes Relative: 10 %
Neutro Abs: 7.5 10*3/uL (ref 1.7–7.7)
Neutrophils Relative %: 55 %
Platelets: 324 10*3/uL (ref 150–400)
RBC: 5.49 MIL/uL (ref 4.22–5.81)
RDW: 12.2 % (ref 11.5–15.5)
WBC: 13.5 10*3/uL — ABNORMAL HIGH (ref 4.0–10.5)
nRBC: 0 % (ref 0.0–0.2)

## 2018-09-26 LAB — ETHANOL: Alcohol, Ethyl (B): <10 mg/dL (ref <10)

## 2018-09-26 LAB — GLUCOSE, CAPILLARY: Glucose-Capillary: 158 mg/dL — ABNORMAL HIGH (ref 70–99)

## 2018-09-26 MED ORDER — SODIUM CHLORIDE 0.9 % IV BOLUS
1000.0000 mL | Freq: Once | INTRAVENOUS | Status: AC
  Administered 2018-09-26: 1000 mL via INTRAVENOUS

## 2018-09-26 MED ORDER — SODIUM CHLORIDE 0.9 % IV SOLN
20.0000 mg/kg | Freq: Once | INTRAVENOUS | Status: AC
  Administered 2018-09-26: 1820 mg via INTRAVENOUS
  Filled 2018-09-26: qty 18.2

## 2018-09-26 NOTE — ED Notes (Signed)
FIRST NURSE NOTE:  Patient to ED by POV but was seen on scene by EMS who states patient had a grand mal seizure while at gym, per EMS patient slightly post ictal with HR between 138-160bpm, last seizure 1 year ago.  Pt presents through lobby with dad ambulatory with steady gait, but even more pale than was on scene according to EMS.

## 2018-09-26 NOTE — Discharge Instructions (Addendum)
Seizures may happen at any time. It is important to take certain precautions to maintain your safety.  Do not drive or operate heavy machinery until cleared by neurology ° °Follow up with your doctor in 1-3 days. ° °If you were started on a seizure medication, take it as prescribed. ° °During a seizure, a person may injure himself or herself. Seizure precautions are guidelines that a person can follow in order to minimize injury during a seizure. For any activity, it is important to ask, "What would happen if I had a seizure while doing this?" Follow the below precautions.  °Bathroom Safety  °A person with seizures may want to shower instead of bathe to avoid accidental drowning. If falls occur during the patient's typical seizure, a person should use a shower seat, preferably one with a safety strap.  °Use nonskid strips in your shower or tub.  °Never use electrical equipment near water. This prevents accidental electrocution.  °Consider changing glass in shower doors to shatterproof glass. ° °Kitchen Safety °If possible, cook when someone else is nearby.  °Use the back burners of the stove to prevent accidental burns.  °Use shatterproof containers as much as possible. For instance, sauces can be transferred from glass bottles to plastic containers for use.  °Limit time that is required using knives or other sharp objects. If possible, buy foods that are already cut, or ask someone to help in meal preparation.  ° °General Safety at Home °Do not smoke or light fires in the fireplace unless someone else is present.  °Do not use space heaters that can be accidentally overturned.  °When alone, avoid using step stools or ladders, and do not clean rooftop gutters.  °Purchase power tools and motorized lawn equipment which have a safety switch that will stop the machine if you release the handle (a 'dead man's' switch).  ° °Driving and Transportation °DO NOT DRIVE UNTIL YOU ARE CLEARED BY A NEUROLOGIST and/or you have  permission to drive from your state's Department of Motor Vehicles  °(DMV). Each state has different laws. Please refer to the following link on the Epilepsy Foundation of [employer]'s website for more information: http://www.epilepsyfoundation.org/answerplace/Social/driving/drivingu.cfm  °If you ride a bicycle, wear a helmet and any other necessary protective gear.  °When taking public transportation like the bus or subway, stay clear of the platform edge.  ° °Outdoor and Sports Safety °Swimming is okay, but does present certain risks. Never swim alone, and tell friends what to do if you have a seizure while swimming.  °Wear appropriate protective equipment.  °Ski with a friend. If a seizure occurs, your friend can seek help, if needed. He or she can also help to get you out of the cold. Consider using a safety hook or belt while riding the ski lift.  ° ° °

## 2018-09-26 NOTE — ED Provider Notes (Signed)
[facility] Emergency Department Provider Note  ____________________________________________  Time seen: Approximately 7:41 PM  I have reviewed the triage vital signs and the nursing notes.   HISTORY  Chief Complaint Seizures   HPI Eddie Archer is a 27 y.o. male with a history of epilepsy on Keppra who presents for evaluation of seizure.  Patient reports that he has not taken his Keppra for at least 3 days.  He was at the gym with his father.  He had a generalized tonic-clonic seizure lasting 45 seconds followed by several minutes of postictal phase.  No tongue trauma, urinary or bowel loss.  Patient did not sustain any injury and denies headache, neck pain, back pain, or extremity pain.  Patient seizures are well-controlled on Keppra and he had not had one for 10 months.  He denies drug or alcohol use.  He is followed with Dr. Malvin Johns.   Past Medical History:  Diagnosis Date  . Essential hypertension 07/10/2018  . Seizures [facility])     Patient Active Problem List   Diagnosis Date Noted  . Opiate overdose (HCC) 08/08/2018  . Essential hypertension 07/10/2018  . Seizure disorder (HCC) 07/10/2018    History reviewed. No pertinent surgical history.  Prior to Admission medications   Medication Sig Start Date End Date Taking? Authorizing Provider  diazepam (DIASTAT ACUDIAL) 10 MG GEL Place 10 mg rectally once for 1 dose. 01/25/18 05/24/18  Rockne Menghini, MD  levETIRAcetam (KEPPRA) 500 MG tablet Take 1 tablet (500 mg total) by mouth 2 (two) times daily. 01/25/18   Rockne Menghini, MD  lisinopril (PRINIVIL,ZESTRIL) 10 MG tablet TAKE 1 TABLET BY MOUTH EVERY DAY 06/06/18   Maple Hudson., MD    Allergies Patient has no known allergies.  Family History  Problem Relation Age of Onset  . Hypertension Mother   . Skin cancer Mother   . Hypertension Father   . Diabetes Father   . Hypertension Maternal Grandmother   . Hypertension Maternal  Grandfather   . Cancer Paternal Grandmother        breast cancer  . Diabetes Paternal Grandfather     Social History Social History   Tobacco Use  . Smoking status: Former Games developer  . Smokeless tobacco: Never Used  Substance Use Topics  . Alcohol use: Yes    Comment: occasionally  . Drug use: Yes    Comment: opiod pills    Review of Systems  Constitutional: Negative for fever. Eyes: Negative for visual changes. ENT: Negative for sore throat. Neck: No neck pain  Cardiovascular: Negative for chest pain. Respiratory: Negative for shortness of breath. Gastrointestinal: Negative for abdominal pain, vomiting or diarrhea. Genitourinary: Negative for dysuria. Musculoskeletal: Negative for back pain. Skin: Negative for rash. Neurological: Negative for headaches, weakness or numbness. + seizure Psych: No SI or HI  ____________________________________________   PHYSICAL EXAM:  VITAL SIGNS: ED Triage Vitals  Enc Vitals Group     BP 09/26/18 1820 118/79     Pulse Rate 09/26/18 1820 (!) 127     Resp 09/26/18 1820 18     Temp --      Temp Source 09/26/18 1820 Oral     SpO2 09/26/18 1820 99 %     Weight 09/26/18 1908 201 lb (91.2 kg)     Height --      Head Circumference --      Peak Flow --      Pain Score 09/26/18 1849 0  Pain Loc --      Pain Edu? --      Excl. in GC? --     Constitutional: Alert and oriented. Well appearing and in no apparent distress. HEENT:      Head: Normocephalic and atraumatic.         Eyes: Conjunctivae are normal. Sclera is non-icteric.       Mouth/Throat: Mucous membranes are moist.       Neck: Supple with no signs of meningismus.  No C-spine tenderness Cardiovascular: Tachycardic with regular rhythm. No murmurs, gallops, or rubs. 2+ symmetrical distal pulses are present in all extremities. No JVD. Respiratory: Normal respiratory effort. Lungs are clear to auscultation bilaterally. No wheezes, crackles, or rhonchi.  Gastrointestinal:  Soft, non tender, and non distended with positive bowel sounds. No rebound or guarding. Musculoskeletal: Nontender with normal range of motion in all extremities. No edema, cyanosis, or erythema of extremities.  No T and L-spine tenderness Neurologic: Normal speech and language. Face is symmetric. Moving all extremities. No gross focal neurologic deficits are appreciated. Skin: Skin is warm, dry and intact. No rash noted. Psychiatric: Mood and affect are normal. Speech and behavior are normal.  ____________________________________________   LABS (all labs ordered are listed, but only abnormal results are displayed)  Labs Reviewed  GLUCOSE, CAPILLARY - Abnormal; Notable for the following components:      Result Value   Glucose-Capillary 158 (*)    All other components within normal limits  CBC WITH DIFFERENTIAL/PLATELET - Abnormal; Notable for the following components:   WBC 13.5 (*)    Lymphs Abs 4.4 (*)    Monocytes Absolute 1.3 (*)    Abs Immature Granulocytes 0.10 (*)    All other components within normal limits  COMPREHENSIVE METABOLIC PANEL - Abnormal; Notable for the following components:   CO2 16 (*)    Glucose, Bld 180 (*)    Creatinine, Ser 1.53 (*)    Total Protein 8.9 (*)    Albumin 5.2 (*)    Anion gap 21 (*)    All other components within normal limits  URINE DRUG SCREEN, QUALITATIVE ([facility] ONLY) - Abnormal; Notable for the following components:   Amphetamines, Ur Screen POSITIVE (*)    Cannabinoid 50 Ng, Ur [place] POSITIVE (*)    All other components within normal limits  ETHANOL   ____________________________________________  EKG  none  ____________________________________________  RADIOLOGY  none  ____________________________________________   PROCEDURES  Procedure(s) performed: None Procedures Critical Care performed:  None ____________________________________________   INITIAL IMPRESSION / ASSESSMENT AND PLAN / ED COURSE   27 y.o. male with a  history of epilepsy on Keppra who presents for evaluation of seizure in the setting of medication noncompliance.  Likely patient did not sustain any injuries during this episode.  I will load him with 20 mg/kg of Keppra.  Will check basic labs.  Will monitor patient in the emergency room for any recurrent seizures.  Discussed the importance of maintaining medication levels in his bloodstream to prevent seizures.    _________________________ 9:29 PM on 09/26/2018 -----------------------------------------  Drug screen positive for cannabinoids and amphetamines.  Patient reports taking an Adderall.  He is not prescribed this medication.  Explained to him the taking medications that are not his and not taking his own medications to prevent seizures will cause him to have more seizures which can lead to significant morbidity and mortality and also injury to others around him.  Discussed importance of being on Keppra.  Discussed not  driving and other seizure precautions.  Discussed close follow-up with patient and his neurologist.  Discussed standard return precautions.   As part of my medical decision making, I reviewed the following data within the electronic MEDICAL RECORD NUMBER Nursing notes reviewed and incorporated, Labs reviewed , Old chart reviewed, Notes from prior ED visits and [place] Controlled Substance Database    Pertinent labs & imaging results that were available during my care of the patient were reviewed by me and considered in my medical decision making (see chart for details).    ____________________________________________   FINAL CLINICAL IMPRESSION(S) / ED DIAGNOSES  Final diagnoses:  Seizure (HCC)  Amphetamine abuse (HCC)  Synthetic cannabinoid abuse (HCC)      NEW MEDICATIONS STARTED DURING THIS VISIT:  ED Discharge Orders    None       Note:  This document was prepared using Dragon voice recognition software and may include unintentional dictation errors.      Nita SickleVeronese, [place], MD 09/26/18 2130

## 2018-09-26 NOTE — ED Notes (Signed)
Unable to obtain temperature. 

## 2018-09-26 NOTE — ED Notes (Signed)
Per pts dad pt had seizure at gym while working out, dad states pt was convulsing started turning blue and his jaw was clenched. Dad states pt did not hit head during seizure. Pts dad states seizure lasted 35-45 seconds and pt was confused post seizure. Pt a&o x4 denies pain.

## 2018-09-26 NOTE — ED Triage Notes (Addendum)
Pt at gym and had seizure like activity. Hx of same. Pt pale, diaphoretic, pupils large. Pt does not recall seizure. Pt takes seizure medications.  Witnessed by family-lasted approx 45 seconds.

## 2019-01-08 ENCOUNTER — Telehealth: Payer: Self-pay | Admitting: Family Medicine

## 2019-01-08 ENCOUNTER — Ambulatory Visit: Payer: Self-pay | Admitting: Family Medicine

## 2019-01-08 NOTE — Telephone Encounter (Signed)
Stark Jock - pt's step father is requesting a call by Dr. Sullivan Lone before pt's appt on Tues.  Pleas call Nadine Counts back at  [phone].  Thanks, [employer]

## 2019-01-08 NOTE — Telephone Encounter (Signed)
Please review. Thanks!  

## 2019-01-09 ENCOUNTER — Encounter: Payer: Self-pay | Admitting: Family Medicine

## 2019-01-09 ENCOUNTER — Other Ambulatory Visit: Payer: Self-pay

## 2019-01-09 ENCOUNTER — Ambulatory Visit (INDEPENDENT_AMBULATORY_CARE_PROVIDER_SITE_OTHER): Payer: Self-pay | Admitting: Family Medicine

## 2019-01-09 VITALS — BP 142/78 | HR 96 | Temp 98.0°F | Ht 71.0 in | Wt 199.0 lb

## 2019-01-09 DIAGNOSIS — F1910 Other psychoactive substance abuse, uncomplicated: Secondary | ICD-10-CM

## 2019-01-09 DIAGNOSIS — I10 Essential (primary) hypertension: Secondary | ICD-10-CM

## 2019-01-09 DIAGNOSIS — G40909 Epilepsy, unspecified, not intractable, without status epilepticus: Secondary | ICD-10-CM

## 2019-01-09 DIAGNOSIS — F319 Bipolar disorder, unspecified: Secondary | ICD-10-CM

## 2019-01-09 MED ORDER — ARIPIPRAZOLE 10 MG PO TABS: 10.0000 mg | ORAL_TABLET | Freq: Every day | ORAL | 3 refills | Status: DC

## 2019-01-09 NOTE — Patient Instructions (Signed)
Call Ramond Dial at [phone]   Start Abilify 10mg  1/2 tablet a day for one week, then increase to 1 tablet a day.

## 2019-01-09 NOTE — Progress Notes (Signed)
Patient: Eddie Archer Male    DOB: [birth year]   27 y.o.   MRN: [id number] Visit Date: 01/09/2019  Today's Provider: Megan Mans, MD   Chief Complaint  Patient presents with  . Hypertension   Subjective:     HPI  The reason for the visit today was listed as hypertension follow-up.  It is not.  Patient is not suicidal or homicidal but is having great difficulty with his emotional state and with substance abuse.  Has history of polysubstance abuse and has been in rehab.  He is now drinking brown liquor and is evidently very mean to his family/mother when he drinks this.  He has refused in the past and refuses a day to see psychiatrist.  He is willing to consider counseling.  He does state that his mind races sometimes.  Most of his conversation today is about what he is willing to do and not willing  to do.  He has been on a benzodiazepine and essentially is requesting this.  Hypertension, follow-up:  BP Readings from Last 3 Encounters:  01/09/19 (!) 142/78  09/26/18 (!) 146/99  08/08/18 134/86    He was last seen for hypertension 6 months ago.  BP at that visit was 138/90. Management since that visit includes no changes. Continue working on diet and exercise. He reports good compliance with treatment. He is not having side effects.  He is not exercising. He is adherent to low salt diet.   Outside blood pressures are not being checked.  Patient denies exertional chest pressure/discomfort, lower extremity edema and palpitations.    Weight trend: stable Wt Readings from Last 3 Encounters:  01/09/19 199 lb (90.3 kg)  09/26/18 201 lb (91.2 kg)  08/08/18 204 lb (92.5 kg)    Current diet: well balanced  No Known Allergies   Current Outpatient Medications:  .  levETIRAcetam (KEPPRA) 500 MG tablet, Take 1 tablet (500 mg total) by mouth 2 (two) times daily., Disp: 60 tablet, Rfl: 0 .  lisinopril (PRINIVIL,ZESTRIL) 10 MG tablet, TAKE 1 TABLET BY MOUTH EVERY DAY,  Disp: 30 tablet, Rfl: 5 .  diazepam (DIASTAT ACUDIAL) 10 MG GEL, Place 10 mg rectally once for 1 dose., Disp: 1 Package, Rfl: 0  Review of Systems  Constitutional: Negative for activity change, appetite change, chills, diaphoresis, fatigue, fever and unexpected weight change.  HENT: Negative.   Eyes: Negative.   Respiratory: Negative for cough.   Cardiovascular: Negative for chest pain, palpitations and leg swelling.  Endocrine: Negative.   Musculoskeletal: Negative.   Allergic/Immunologic: Negative.   Neurological: Negative for dizziness, weakness and headaches.  Psychiatric/Behavioral: Positive for agitation. Negative for self-injury, sleep disturbance and suicidal ideas. The patient is nervous/anxious.     Social History   Tobacco Use  . Smoking status: Former Games developer  . Smokeless tobacco: Never Used  Substance Use Topics  . Alcohol use: Yes    Comment: occasionally      Objective:   BP (!) 142/78 (BP Location: Left Arm, Patient Position: Sitting, Cuff Size: Normal)   Pulse 96   Temp 98 F (36.7 C)   Ht 5\' 11"  (1.803 m)   Wt 199 lb (90.3 kg)   SpO2 100%   BMI 27.75 kg/m  Vitals:   01/09/19 1624  BP: (!) 142/78  Pulse: 96  Temp: 98 F (36.7 C)  SpO2: 100%  Weight: 199 lb (90.3 kg)  Height: 5\' 11"  (1.803 m)     Physical  Exam Vitals signs reviewed.  Constitutional:      Appearance: He is well-developed.  HENT:     Head: Normocephalic and atraumatic.     Right Ear: External ear normal.     Left Ear: External ear normal.     Nose: Nose normal.  Eyes:     General: No scleral icterus.    Conjunctiva/sclera: Conjunctivae normal.  Neck:     Musculoskeletal: Neck supple.     Thyroid: No thyromegaly.  Cardiovascular:     Rate and Rhythm: Normal rate and regular rhythm.     Heart sounds: Normal heart sounds.  Pulmonary:     Effort: Pulmonary effort is normal.     Breath sounds: Normal breath sounds.  Abdominal:     Palpations: Abdomen is soft.   Musculoskeletal: Normal range of motion.  Lymphadenopathy:     Cervical: No cervical adenopathy.  Skin:    General: Skin is warm and dry.  Neurological:     General: No focal deficit present.     Mental Status: He is alert and oriented to person, place, and time.     Cranial Nerves: No cranial nerve deficit.     Motor: No abnormal muscle tone.     Coordination: Coordination normal.  Psychiatric:        Behavior: Behavior normal.        Thought Content: Thought content normal.        Judgment: Judgment normal.         Assessment & Plan    1. Bipolar depression [facility]) Patient refuses to see psychiatry.  He is not suicidal or homicidal.  I think he is probably bipolar.  No evidence of psychosis.  I would not use a benzodiazepine on him going forward unless he is seen by psychiatry.  At this time will start Abilify 5 mg daily for a week and go to 10 mg and see him back in 2 weeks.  2. Substance abuse (HCC) Patient has polysubstance abuse including alcohol.  Have strongly advised to totally discontinue.  3. Essential hypertension We will reassess blood pressure on next visit when he is less agitated.  4. Seizure disorder [facility]) Patient has not been taking his Keppra.  Advised him it would disrupt his life he has another seizure and has to go 6 months again without driving.  Vies restarting Keppra and follow-up with neurology as appropriate.    I have done the exam and reviewed the above chart and it is accurate to the best of my knowledge. Dentist has been used in this note in any air is in the dictation or transcription are unintentional.  Megan Mans, MD  [facility]

## 2019-01-10 ENCOUNTER — Other Ambulatory Visit: Payer: Self-pay | Admitting: Family Medicine

## 2019-01-10 MED ORDER — ARIPIPRAZOLE 10 MG PO TABS: 10.0000 mg | ORAL_TABLET | Freq: Every day | ORAL | 3 refills | Status: AC

## 2019-01-10 NOTE — Telephone Encounter (Signed)
Pt needing Rx sent to another pharmacy due to cost. ARIPiprazole (ABILIFY) 10 MG tablet  Please send to [facility] on [address] in [place].  Thanks, [employer]

## 2019-02-21 ENCOUNTER — Ambulatory Visit: Payer: Self-pay | Admitting: Family Medicine

## 2019-02-22 ENCOUNTER — Ambulatory Visit: Payer: Self-pay | Admitting: Family Medicine

## 2019-02-22 ENCOUNTER — Encounter: Payer: Self-pay | Admitting: Family Medicine

## 2019-02-22 ENCOUNTER — Other Ambulatory Visit: Payer: Self-pay

## 2019-02-22 VITALS — BP 118/88 | HR 93 | Temp 97.9°F | Ht 73.0 in | Wt 199.6 lb

## 2019-02-22 DIAGNOSIS — T40601D Poisoning by unspecified narcotics, accidental (unintentional), subsequent encounter: Secondary | ICD-10-CM

## 2019-02-22 DIAGNOSIS — G40909 Epilepsy, unspecified, not intractable, without status epilepticus: Secondary | ICD-10-CM

## 2019-02-22 DIAGNOSIS — I10 Essential (primary) hypertension: Secondary | ICD-10-CM

## 2019-02-22 DIAGNOSIS — F319 Bipolar disorder, unspecified: Secondary | ICD-10-CM

## 2019-02-22 NOTE — Progress Notes (Signed)
Patient: Eddie Archer Male    DOB: 10/18/1991   28 y.o.   MRN: [id number] Visit Date: 02/22/2019  Today's Provider: Megan Mansichard  Jr, MD   Chief Complaint  Patient presents with  . Follow-up    1 month medications started on abilify   Subjective:     HPI   Follow up for medications 1 month  The patient was last seen for this 1 months ago. Changes made at last visit include started Abilify at 5 mg for 1 week and go to 10 mg. He is feeling a good bit better. He reports fair compliance with treatment. He feels that condition is Improved. He is not having side effects.   ------------------------------------------------------------------------------------   No Known Allergies   Current Outpatient Medications:  .  ARIPiprazole (ABILIFY) 10 MG tablet, Take 1 tablet (10 mg total) by mouth daily., Disp: 30 tablet, Rfl: 3 .  diazepam (DIASTAT ACUDIAL) 10 MG GEL, Place 10 mg rectally once for 1 dose., Disp: 1 Package, Rfl: 0 .  levETIRAcetam (KEPPRA) 500 MG tablet, Take 1 tablet (500 mg total) by mouth 2 (two) times daily., Disp: 60 tablet, Rfl: 0 .  lisinopril (PRINIVIL,ZESTRIL) 10 MG tablet, TAKE 1 TABLET BY MOUTH EVERY DAY, Disp: 30 tablet, Rfl: 5  Review of Systems  Constitutional: Negative.   HENT: Negative.   Eyes: Negative.   Respiratory: Negative.   Cardiovascular: Negative.   Gastrointestinal: Negative.   Endocrine: Negative.   Genitourinary: Negative.   Musculoskeletal: Negative.   Skin: Negative.   Allergic/Immunologic: Negative.   Neurological: Negative.   Hematological: Negative.   Psychiatric/Behavioral: Negative.     Social History   Tobacco Use  . Smoking status: Former Games developermoker  . Smokeless tobacco: Never Used  Substance Use Topics  . Alcohol use: Yes    Comment: occasionally      Objective:   BP 118/88 (BP Location: Right Arm, Patient Position: Sitting, Cuff Size: Normal)   Pulse 93   Temp 97.9 F (36.6 C) (Oral)   Ht 6\' 1"  (1.854  m)   Wt 199 lb 9.6 oz (90.5 kg)   SpO2 99%   BMI 26.33 kg/m  Vitals:   02/22/19 0856  BP: 118/88  Pulse: 93  Temp: 97.9 F (36.6 C)  TempSrc: Oral  SpO2: 99%  Weight: 199 lb 9.6 oz (90.5 kg)  Height: 6\' 1"  (1.854 m)     Physical Exam Vitals signs reviewed.  Constitutional:      Appearance: He is well-developed.  HENT:     Head: Normocephalic and atraumatic.     Right Ear: External ear normal.     Left Ear: External ear normal.     Nose: Nose normal.  Eyes:     General: No scleral icterus.    Conjunctiva/sclera: Conjunctivae normal.  Neck:     Musculoskeletal: Neck supple.     Thyroid: No thyromegaly.  Cardiovascular:     Rate and Rhythm: Normal rate and regular rhythm.     Heart sounds: Normal heart sounds.  Pulmonary:     Effort: Pulmonary effort is normal.     Breath sounds: Normal breath sounds.  Abdominal:     Palpations: Abdomen is soft.  Musculoskeletal: Normal range of motion.  Lymphadenopathy:     Cervical: No cervical adenopathy.  Skin:    General: Skin is warm and dry.  Neurological:     General: No focal deficit present.     Mental Status: He is  alert and oriented to person, place, and time.     Cranial Nerves: No cranial nerve deficit.     Motor: No abnormal muscle tone.     Coordination: Coordination normal.  Psychiatric:        Behavior: Behavior normal.        Thought Content: Thought content normal.        Judgment: Judgment normal.         Assessment & Plan    1. Bipolar depression (HCC) Likely bipolar 2 depression.  Improved on Abilify. We will add low-dose gabapentin to help with sleep. 2. Essential hypertension   3. Seizure disorder (HCC)   4. Opiate overdose, accidental or unintentional, subsequent encounter Patient states he has been sober.    I have done the exam and reviewed the above chart and it is accurate to the best of my knowledge. Dentist has been used in this note in any air is in the dictation or  transcription are unintentional.  Megan Mans, MD  [facility]

## 2019-04-23 ENCOUNTER — Ambulatory Visit: Payer: Self-pay | Admitting: Family Medicine

## 2019-09-09 IMAGING — MR MR HEAD W/O CM
11 series · 48 of 48 positions shown · non-contrast
Comparison: None.

CLINICAL DATA: 26 y/o M; seizure at approximately 4 p.m. today. For
seizures since Sunday September, 2017.

EXAM:
MRI HEAD WITHOUT CONTRAST
TECHNIQUE: Multiplanar, multiecho pulse sequences of the brain and surrounding
structures were obtained without intravenous contrast.

[Series 3: DWI · axial · 3.0mm · 1.80mm/px · z∈[-57,+103]mm · 7 of 54 slices shown (1 of 2)]
[im 1/54]
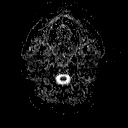
[im 9/54]
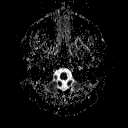
[im 18/54]
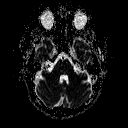
[im 27/54]
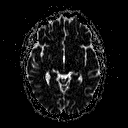
[im 36/54]
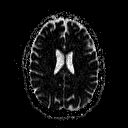
[im 45/54]
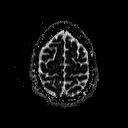
[im 54/54]
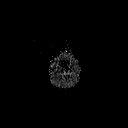

[Series 5: DWI · coronal · 3.0mm · 1.80mm/px · 5 of 48 slices shown (2 of 2)]
[im 1/48]
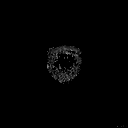
[im 12/48]
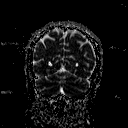
[im 24/48]
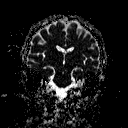
[im 36/48]
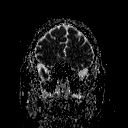
[im 48/48]
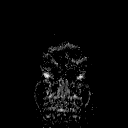

[Series 6: T1 · sagittal · 5.0mm · 0.45mm/px · 3 of 29 slices shown (1 of 2)]
[im 1/29]
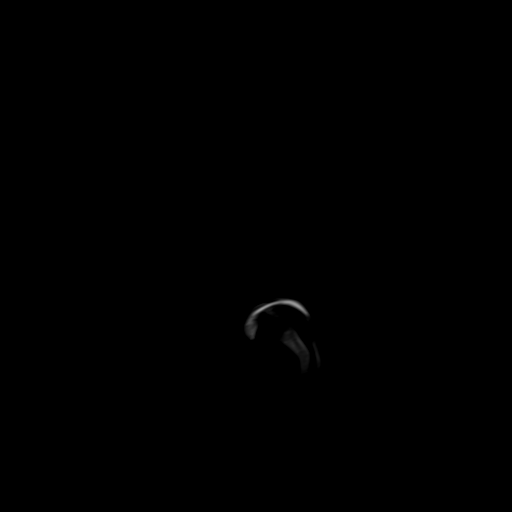
[im 15/29]
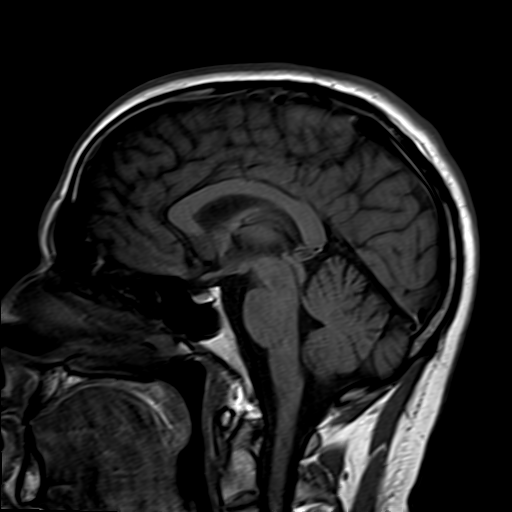
[im 29/29]
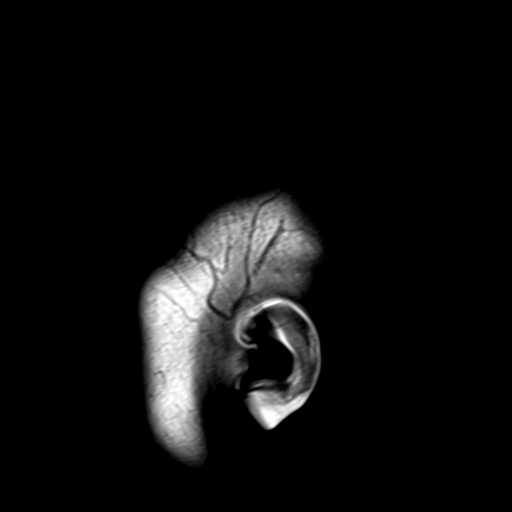

[Series 7: T2 · axial · 5.0mm · 0.90mm/px · z∈[-55,+105]mm · 3 of 26 slices shown (1 of 4)]
[im 1/26]
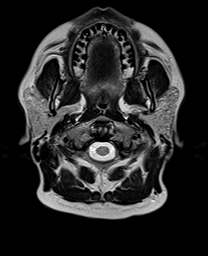
[im 13/26]
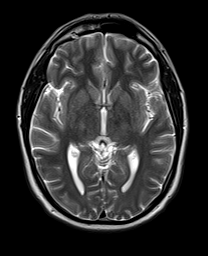
[im 26/26]
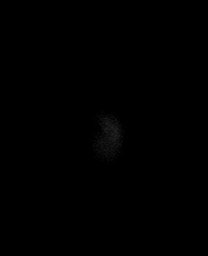

[Series 8: FLAIR · axial · 5.0mm · 0.45mm/px · z∈[-55,+105]mm · 3 of 26 slices shown]
[im 1/26]
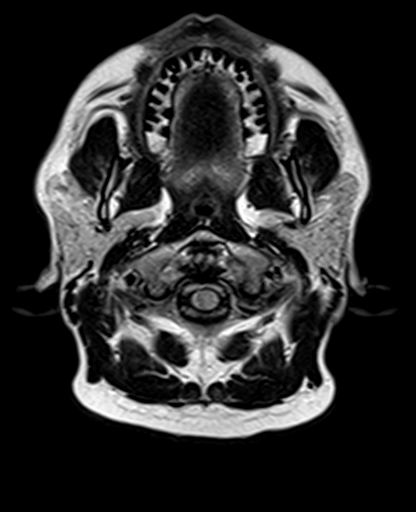
[im 13/26]
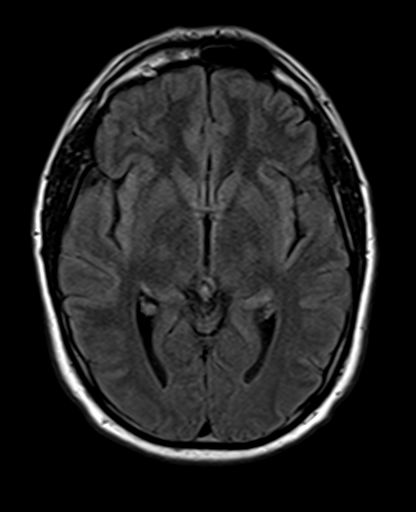
[im 26/26]
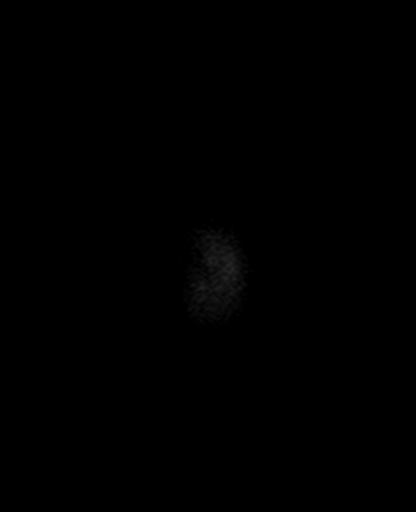

[Series 9: T2 · axial · 5.0mm · 0.45mm/px · z∈[-55,+105]mm · 3 of 26 slices shown (2 of 4)]
[im 1/26]
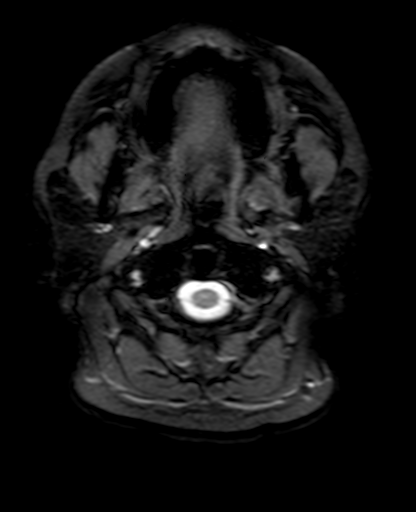
[im 13/26]
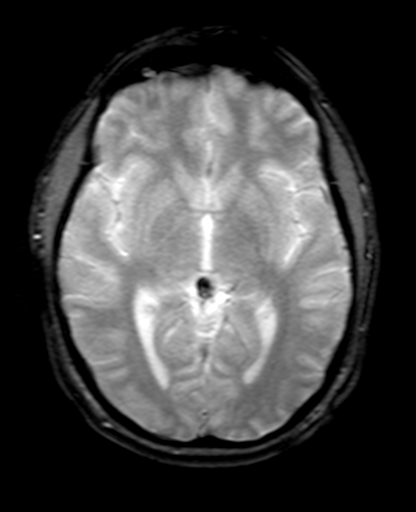
[im 26/26]
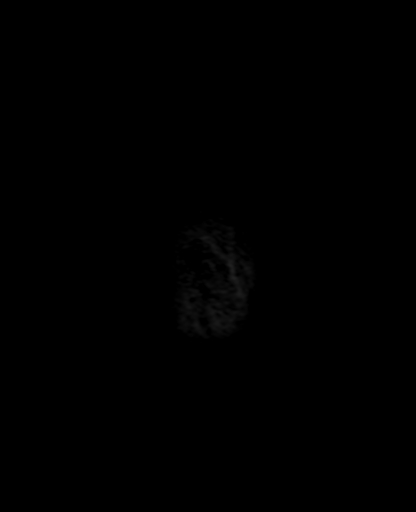

[Series 10: T1 · axial · 3.0mm · 1.00mm/px · z∈[-48,+103]mm · 6 of 52 slices shown (2 of 2)]
[im 1/52]
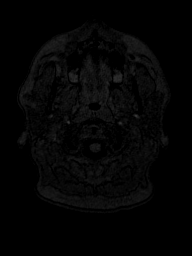
[im 11/52]
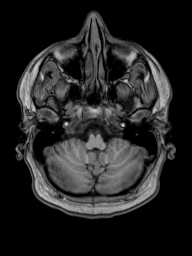
[im 21/52]
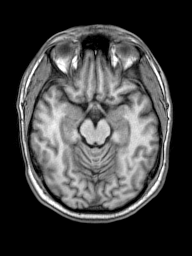
[im 31/52]
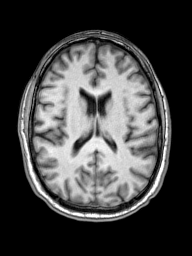
[im 41/52]
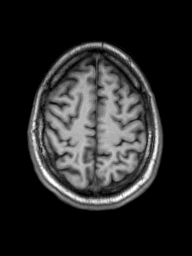
[im 52/52]
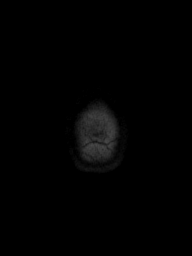

[Series 11: T2 · coronal · 3.0mm · 0.47mm/px · 4 of 31 slices shown (3 of 4)]
[im 1/31]
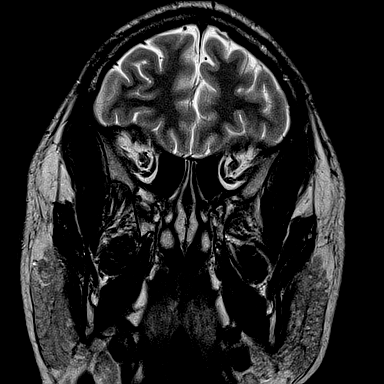
[im 11/31]
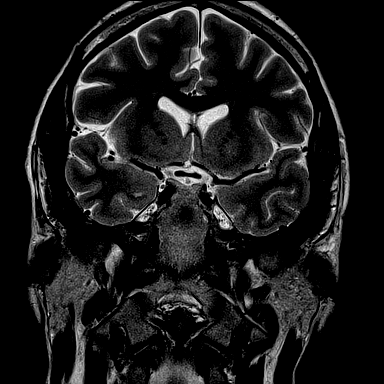
[im 21/31]
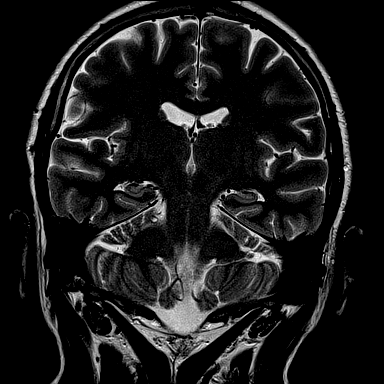
[im 31/31]
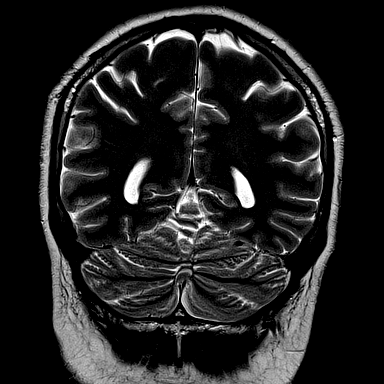

[Series 12: T2 · coronal · 5.0mm · 0.86mm/px · 3 of 30 slices shown (4 of 4)]
[im 1/30]
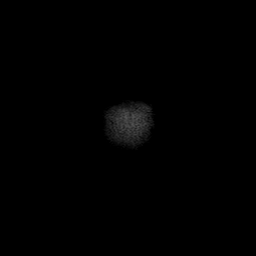
[im 15/30]
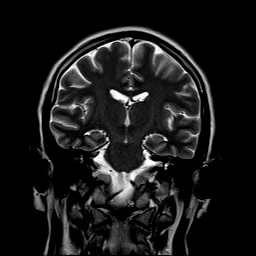
[im 30/30]
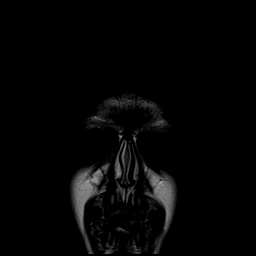

[Series 100: ax (id) · axial · 3.0mm · 1.80mm/px · z∈[-57,+103]mm · 6 of 55 slices shown]
[im 1/55]
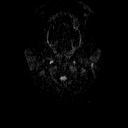
[im 11/55]
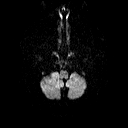
[im 22/55]
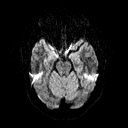
[im 33/55]
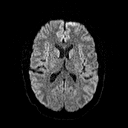
[im 44/55]
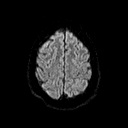
[im 55/55]
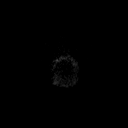

[Series 101: cor (id) · coronal · 3.0mm · 1.80mm/px · 5 of 48 slices shown]
[im 1/48]
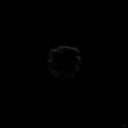
[im 12/48]
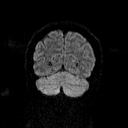
[im 24/48]
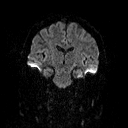
[im 36/48]
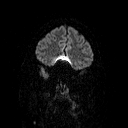
[im 48/48]
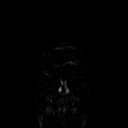

[48 of 48 positions shown; findings below may reference images not displayed]

FINDINGS: Brain: No acute infarction, hemorrhage, hydrocephalus, extra-axial
collection or mass lesion. Morphologically normal pituitary, corpus
callosum, and vermis. No disorder cortical formation, gray matter
heterotopia, or cortical dysplasia identified. Hippocampi are
symmetric in size and signal.

Vascular: Normal flow voids.

Skull and upper cervical spine: Normal marrow signal.

Sinuses/Orbits: Negative.

Other: None.
IMPRESSION: No structural cause of seizure identified.  Normal MRI of the brain.

By: Benrabah Etoil M.D.

## 2024-11-15 ENCOUNTER — Ambulatory Visit: Payer: Self-pay | Admitting: Physician Assistant

## 2024-12-24 ENCOUNTER — Ambulatory Visit: Payer: Self-pay | Admitting: Family Medicine
